# Patient Record
Sex: Male | Born: 1935 | Race: White | Hispanic: No | Marital: Married | Smoking: Never smoker
Health system: Southern US, Community
[De-identification: ages and names within clinical notes are randomized; demographics above are authoritative.]

## PROBLEM LIST (undated history)

## (undated) DIAGNOSIS — I77810 Thoracic aortic ectasia: Secondary | ICD-10-CM

## (undated) DIAGNOSIS — R011 Cardiac murmur, unspecified: Secondary | ICD-10-CM

## (undated) DIAGNOSIS — G4733 Obstructive sleep apnea (adult) (pediatric): Secondary | ICD-10-CM

## (undated) HISTORY — DX: Cardiac murmur, unspecified: R01.1

## (undated) HISTORY — DX: Obstructive sleep apnea (adult) (pediatric): G47.33

## (undated) HISTORY — DX: Thoracic aortic ectasia: I77.810

---

## 2012-11-01 ENCOUNTER — Encounter: Payer: Self-pay | Admitting: Cardiology

## 2013-06-28 ENCOUNTER — Encounter: Payer: Self-pay | Admitting: *Deleted

## 2013-06-28 ENCOUNTER — Encounter: Payer: Self-pay | Admitting: Cardiology

## 2013-06-29 ENCOUNTER — Encounter: Payer: Self-pay | Admitting: Cardiology

## 2013-06-29 DIAGNOSIS — G4733 Obstructive sleep apnea (adult) (pediatric): Secondary | ICD-10-CM | POA: Insufficient documentation

## 2013-06-29 DIAGNOSIS — I77810 Thoracic aortic ectasia: Secondary | ICD-10-CM | POA: Insufficient documentation

## 2013-06-30 ENCOUNTER — Encounter: Payer: Self-pay | Admitting: Cardiology

## 2013-06-30 ENCOUNTER — Ambulatory Visit (INDEPENDENT_AMBULATORY_CARE_PROVIDER_SITE_OTHER): Payer: Medicare Other | Admitting: Cardiology

## 2013-06-30 VITALS — BP 115/70 | HR 76 | Ht 69.0 in | Wt 157.8 lb

## 2013-06-30 DIAGNOSIS — G4733 Obstructive sleep apnea (adult) (pediatric): Secondary | ICD-10-CM

## 2013-06-30 DIAGNOSIS — I499 Cardiac arrhythmia, unspecified: Secondary | ICD-10-CM

## 2013-06-30 DIAGNOSIS — I77810 Thoracic aortic ectasia: Secondary | ICD-10-CM

## 2013-06-30 NOTE — Progress Notes (Signed)
  [address] [place], [place]  [address] Phone: [phone] Fax:  [phone]  Date:  06/30/2013   ID:  DEEN DEGUIA, DOB 1931-12-10, MRN [id number]  PCP:  No primary provider on file.  Sleep medicine:  Armanda Magic, MD     History of Present Illness: Lawrence Watts is a 79 y.o. male with a history of OSA and mildly dilated aortic root.  He is doing well with his CPAP.  He uses a full face mask.  He tolerates the mask and pressure well.  He feels rested when he gets up in the am and has no daytime sleepiness.  He works in the yard and walks for exercise.   Wt Readings from Last 3 Encounters:  06/30/13 157 lb 12.8 oz (71.578 kg)     Past Medical History  Diagnosis Date  . OSA (obstructive sleep apnea)     severe with AHI 39/hr now on CPAP at 11cm H2O  . Dilated aortic root   . Heart murmur     Mild AI and moderate AVSC by echo    Current Outpatient Prescriptions  Medication Sig Dispense Refill  . meloxicam (MOBIC) 15 MG tablet Take 1 tablet by mouth daily.      . pravastatin (PRAVACHOL) 20 MG tablet Take 1 tablet by mouth daily.       No current facility-administered medications for this visit.    Allergies:   No Known Allergies  Social History:  The patient  reports that he has never smoked. He does not have any smokeless tobacco history on file. He reports that he does not drink alcohol or use illicit drugs.   Family History:  The patient's family history includes Dementia in his father and mother; Epilepsy in his sister; Leukemia in his sister.   ROS:  Please see the history of present illness.      All other systems reviewed and negative.   PHYSICAL EXAM: VS:  BP 115/70  Pulse 76  Ht 5\' 9"  (1.753 m)  Wt 157 lb 12.8 oz (71.578 kg)  BMI 23.29 kg/m2 Well nourished, well developed, in no acute distress HEENT: normal Neck: no JVD Cardiac:  normal S1, S2; RRR; no murmur Lungs:  clear to auscultation bilaterally, no wheezing, rhonchi or  rales Abd: soft, nontender, no hepatomegaly Ext: no edema Skin: warm and dry Neuro:  CNs 2-12 intact, no focal abnormalities noted  EKG:  NSR with sinus arrhythmia, IRBBB and septal Q waves      ASSESSMENT AND PLAN:  1. OSA on CPAP tolerating well with no daytime sleepiness  - will get a d/l from his DME 2.  Mildly dilated aorta followed by yearly echo  Followup with me in 6 months  Signed, Armanda Magic, MD 06/30/2013 9:36 AM

## 2013-06-30 NOTE — Patient Instructions (Addendum)
We will be contacting [facility] to call you for a download on your CPAP machine.   Your physician has requested that you have an echocardiogram. Echocardiography is a painless test that uses sound waves to create images of your heart. It provides your doctor with information about the size and shape of your heart and how well your heart's chambers and valves are working. This procedure takes approximately one hour. There are no restrictions for this procedure. (Schedule for 12/2013)   Your physician wants you to follow-up in: 6 months for a sleep f/u with Dr. Sherlyn Lick will receive a reminder letter in the mail two months in advance. If you don't receive a letter, please call our office to schedule the follow-up appointment.

## 2013-07-07 ENCOUNTER — Encounter: Payer: Self-pay | Admitting: Cardiology

## 2013-09-08 ENCOUNTER — Telehealth: Payer: Self-pay | Admitting: Cardiology

## 2013-09-08 NOTE — Telephone Encounter (Signed)
Spoke with pt and he was able to buy a used machine thru [facility] and he just needed to know the pressure on his machine so they could set this one for him.

## 2013-09-08 NOTE — Telephone Encounter (Signed)
New Problem:  Pt's girlfriend, Mrs. Lawrence Watts, is stating the pt left his c-pap at home and wants to know if he can rent one. Mrs. Lawrence Watts states she was told she could rent one from any hospital. Mrs Lawrence Watts wants to know if Dr. Mayford Knife or her nurse can help the pt by calling the hospital and helping them rent one for a couple nights. Mrs. Lawrence Watts is requesting a call back.

## 2013-12-29 ENCOUNTER — Ambulatory Visit (HOSPITAL_COMMUNITY): Payer: Medicare Other | Attending: Cardiology | Admitting: Radiology

## 2013-12-29 DIAGNOSIS — R011 Cardiac murmur, unspecified: Secondary | ICD-10-CM

## 2013-12-29 DIAGNOSIS — I77819 Aortic ectasia, unspecified site: Secondary | ICD-10-CM | POA: Insufficient documentation

## 2013-12-29 DIAGNOSIS — I77810 Thoracic aortic ectasia: Secondary | ICD-10-CM

## 2013-12-29 NOTE — Progress Notes (Signed)
Echocardiogram performed.  

## 2014-01-04 ENCOUNTER — Other Ambulatory Visit: Payer: Self-pay | Admitting: General Surgery

## 2014-01-04 DIAGNOSIS — I77810 Thoracic aortic ectasia: Secondary | ICD-10-CM

## 2014-01-04 DIAGNOSIS — G4733 Obstructive sleep apnea (adult) (pediatric): Secondary | ICD-10-CM

## 2014-01-07 ENCOUNTER — Other Ambulatory Visit: Payer: Medicare Other

## 2014-01-10 ENCOUNTER — Other Ambulatory Visit: Payer: Medicare Other

## 2014-02-02 ENCOUNTER — Other Ambulatory Visit (INDEPENDENT_AMBULATORY_CARE_PROVIDER_SITE_OTHER): Payer: Medicare Other

## 2014-02-02 DIAGNOSIS — I77810 Thoracic aortic ectasia: Secondary | ICD-10-CM

## 2014-02-02 LAB — BASIC METABOLIC PANEL
BUN: 14 mg/dL (ref 6–23)
CALCIUM: 9 mg/dL (ref 8.4–10.5)
CO2: 29 meq/L (ref 19–32)
CREATININE: 0.6 mg/dL (ref 0.4–1.5)
Chloride: 105 mEq/L (ref 96–112)
GFR: 128.01 mL/min (ref >60.00)
Glucose, Bld: 124 mg/dL — ABNORMAL HIGH (ref 70–99)
Potassium: 4.1 mEq/L (ref 3.5–5.1)
SODIUM: 139 meq/L (ref 135–145)

## 2014-02-03 ENCOUNTER — Ambulatory Visit (INDEPENDENT_AMBULATORY_CARE_PROVIDER_SITE_OTHER)
Admission: RE | Admit: 2014-02-03 | Discharge: 2014-02-03 | Disposition: A | Discharge status: Home / Self Care | Payer: Medicare Other | Source: Ambulatory Visit | Attending: Cardiology | Admitting: Cardiology

## 2014-02-03 DIAGNOSIS — I77810 Thoracic aortic ectasia: Secondary | ICD-10-CM

## 2014-02-03 MED ORDER — IOHEXOL 350 MG/ML SOLN
100.0000 mL | Freq: Once | INTRAVENOUS | Status: AC | PRN
  Administered 2014-02-03: 100 mL via INTRAVENOUS

## 2014-02-07 ENCOUNTER — Telehealth: Payer: Self-pay | Admitting: Cardiology

## 2014-02-07 DIAGNOSIS — I2510 Atherosclerotic heart disease of native coronary artery without angina pectoris: Secondary | ICD-10-CM

## 2014-02-07 DIAGNOSIS — I712 Thoracic aortic aneurysm, without rupture: Secondary | ICD-10-CM

## 2014-02-07 DIAGNOSIS — I2584 Coronary atherosclerosis due to calcified coronary lesion: Secondary | ICD-10-CM

## 2014-02-07 DIAGNOSIS — I7121 Aneurysm of the ascending aorta, without rupture: Secondary | ICD-10-CM

## 2014-02-07 NOTE — Telephone Encounter (Signed)
New message    Has questions regarding test results report.

## 2014-02-07 NOTE — Telephone Encounter (Signed)
Myoview scheduled on 02/22/14.

## 2014-02-07 NOTE — Telephone Encounter (Signed)
Notes Recorded by Sharyn Blitz, RN on 02/07/2014 at 10:46 AM I spoke with the pt and made him aware of results. The pt would like a copy of results mailed to his home to take to PCP in [place] for follow-up of lung nodule. I will refer the pt to Dr Laneta Simmers and have scheduling contact the pt for [facility].     Notes Recorded by Quintella Reichert, MD on 02/03/2014 at 12:01 PM He also was noted to have moderate coronary artery calcifications so please set up for Lexiscan myoview to assess for ischemia ------  Notes Recorded by Quintella Reichert, MD on 02/03/2014 at 12:00 PM Please let patient know that echo findings are accurate and chest CT shows moderate ascending aortic dilatation at 4.6 x 4.5 cm diameter. Please refer to Dr. Laneta Simmers for further evaluation. Also his chest CT showed a 15mm lung nodule that needs to be followed by his PCP - please forward this to his PCP and notify his PCP's nurse that this need to be followed up on

## 2014-02-09 ENCOUNTER — Encounter: Payer: Medicare Other | Admitting: Surgery

## 2014-02-22 ENCOUNTER — Ambulatory Visit (HOSPITAL_COMMUNITY): Payer: Medicare Other | Attending: Cardiology | Admitting: Radiology

## 2014-02-22 VITALS — BP 145/83 | HR 55 | Ht 69.0 in | Wt 158.0 lb

## 2014-02-22 DIAGNOSIS — Z87891 Personal history of nicotine dependence: Secondary | ICD-10-CM | POA: Insufficient documentation

## 2014-02-22 DIAGNOSIS — I2510 Atherosclerotic heart disease of native coronary artery without angina pectoris: Secondary | ICD-10-CM | POA: Insufficient documentation

## 2014-02-22 DIAGNOSIS — R9439 Abnormal result of other cardiovascular function study: Secondary | ICD-10-CM

## 2014-02-22 DIAGNOSIS — I2584 Coronary atherosclerosis due to calcified coronary lesion: Secondary | ICD-10-CM

## 2014-02-22 DIAGNOSIS — I77819 Aortic ectasia, unspecified site: Secondary | ICD-10-CM | POA: Insufficient documentation

## 2014-02-22 MED ORDER — TECHNETIUM TC 99M SESTAMIBI GENERIC - CARDIOLITE
10.8000 | Freq: Once | INTRAVENOUS | Status: AC | PRN
  Administered 2014-02-22: 11 via INTRAVENOUS

## 2014-02-22 MED ORDER — REGADENOSON 0.4 MG/5ML IV SOLN
0.4000 mg | Freq: Once | INTRAVENOUS | Status: AC
  Administered 2014-02-22: 0.4 mg via INTRAVENOUS

## 2014-02-22 MED ORDER — TECHNETIUM TC 99M SESTAMIBI GENERIC - CARDIOLITE
33.0000 | Freq: Once | INTRAVENOUS | Status: AC | PRN
  Administered 2014-02-22: 33 via INTRAVENOUS

## 2014-02-22 NOTE — Progress Notes (Signed)
[facility] NUCLEAR MED [address] [place], [place] [address] [phone]    Cardiology Nuclear Med Study  Lynnell CatalanRobert M Luevano is a 80 y.o. male     MRN : [id number]     DOB: 01/14/1932  Procedure Date: 02/22/2014  Nuclear Med Background Indication for Stress Test:  Evaluation for Ischemia, and 01-04-14 CT Chest: Moderate Coronary Calcification, and Moderate Ascending Aortic Dilatation History:  No known CAD, Echo 2015 EF 55-60% (mildly dilated aortic root) Cardiac Risk Factors: History of Smoking, Lipids and IRBBB  Symptoms:  None indicated   Nuclear Pre-Procedure Caffeine/Decaff Intake:  None> 12 hrs NPO After: 6:00pm   Lungs:  clear O2 Sat: 97% on room air. IV 0.9% NS with Angio Cath:  22g  IV Site: R Hand x 1, tolerated well IV Started by:  Irean HongPatsy Edwards, RN  Chest Size (in):  38 Cup Size: n/a  Height: 5\' 9"  (1.753 m)  Weight:  158 lb (71.668 kg)  BMI:  Body mass index is 23.32 kg/(m^2). Tech Comments:  N/A    Nuclear Med Study 1 or 2 day study: 1 day  Stress Test Type:  Treadmill/Lexiscan  Reading MD: N/A  Order Authorizing Provider: Armanda Magicraci Turner, MD  Resting Radionuclide: Technetium 4764m Sestamibi  Resting Radionuclide Dose: 11.0 mCi   Stress Radionuclide:  Technetium 2464m Sestamibi  Stress Radionuclide Dose: 33.0 mCi           Stress Protocol Rest HR: 55 Stress HR: 95  Rest BP: 145/83 Stress BP: 132/73  Exercise Time (min): n/a METS: n/a           Dose of Adenosine (mg):  n/a Dose of Lexiscan: 0.4 mg  Dose of Atropine (mg): n/a Dose of Dobutamine: n/a mcg/kg/min (at max HR)  Stress Test Technologist: Nelson ChimesSharon Brooks, BS-ES  Nuclear Technologist:  Doyne Keelonya Yount, CNMT     Rest Procedure:  Myocardial perfusion imaging was performed at rest 45 minutes following the intravenous administration of Technetium 1164m Sestamibi. Rest ECG: Sinus bradycardia rate 55 with nonspecific QRS widening  Stress Procedure:  The patient received IV Lexiscan 0.4 mg  over 15-seconds with concurrent low level exercise and then Technetium 6664m Sestamibi was injected at 30-seconds while the patient continued walking one more minute.  Quantitative spect images were obtained after a 45-minute delay.  During the infusion of Lexiscan the patient complained of SOB, dizziness and pressure behind his eyes.  These symptoms began to resolve in recovery.  Stress ECG: No significant change from baseline ECG  QPS Raw Data Images:  Normal; no motion artifact; normal heart/lung ratio. Stress Images:  There is subtle decreased uptake along the basal inferior lateral portion seen at both rest and stress. No evidence of ischemia identified. Rest Images:  As above Subtraction (SDS):  No evidence of ischemia. Transient Ischemic Dilatation (Normal <1.22):  1.08 Lung/Heart Ratio (Normal <0.45):  0.23  Quantitative Gated Spect Images QGS EDV:  104 ml QGS ESV:  52 ml  Impression Exercise Capacity:  Lexiscan with low level exercise. BP Response:  Normal blood pressure response. Clinical Symptoms:  Typical symptoms with Lexiscan. ECG Impression:  No significant ST segment change suggestive of ischemia. Comparison with Prior Nuclear Study: No images to compare  Overall Impression:  Low risk stress nuclear study with no areas of ischemia identified..  LV Ejection Fraction: 50%.  LV Wall Motion:  NL LV Function; NL Wall Motion  Donato SchultzSKAINS, MARK, MD

## 2014-02-23 ENCOUNTER — Encounter: Payer: Self-pay | Admitting: Surgery

## 2014-02-23 ENCOUNTER — Institutional Professional Consult (permissible substitution) (INDEPENDENT_AMBULATORY_CARE_PROVIDER_SITE_OTHER): Payer: Medicare Other | Admitting: Surgery

## 2014-02-23 VITALS — BP 128/71 | HR 69 | Resp 16 | Ht 69.0 in | Wt 160.0 lb

## 2014-02-23 DIAGNOSIS — I712 Thoracic aortic aneurysm, without rupture, unspecified: Secondary | ICD-10-CM

## 2014-02-27 ENCOUNTER — Encounter: Payer: Self-pay | Admitting: Surgery

## 2014-02-27 NOTE — Progress Notes (Signed)
Cardiothoracic Surgery Consultation   PCP is No primary provider on file. Referring Provider is Quintella Reicherturner, Traci R, MD  Chief Complaint  Patient presents with  . Thoracic Aortic Aneurysm    eval and treat.Marland Kitchen.Marland Kitchen.CTA CHEST 02/03/14    HPI:  The patient is a 80 year old fairly active gentleman with a history of OSA on CPAP who has a known dilated aortic root that has been followed by echo. An echo on 12/29/2013 showed the diameter of the ascending aorta to be 44 mm and an echo 1 year ago showed it to be 36 mm. He had a CTA of the chest on 02/07/2014 that showed the ascending aorta to be 4.6 x 4.5 cm. There was also a 5 mm non-calcified nodule in the RUL adjacent to the minor fissure.  Past Medical History  Diagnosis Date  . OSA (obstructive sleep apnea)     severe with AHI 39/hr now on CPAP at 11cm H2O  . Dilated aortic root   . Heart murmur     Mild AI and moderate AVSC by echo    History reviewed. No pertinent past surgical history.  Family History  Problem Relation Age of Onset  . Dementia Father   . Dementia Mother   . Leukemia Sister   . Epilepsy Sister     Social History History  Substance Use Topics  . Smoking status: Never Smoker   . Smokeless tobacco: Not on file  . Alcohol Use: No    Current Outpatient Prescriptions  Medication Sig Dispense Refill  . pravastatin (PRAVACHOL) 20 MG tablet Take 1 tablet by mouth daily.       No current facility-administered medications for this visit.    No Known Allergies  Review of Systems  Constitutional: Negative.   HENT: Negative.   Eyes: Negative.   Respiratory: Negative.   Cardiovascular: Negative.   Gastrointestinal: Negative.   Endocrine: Negative.   Genitourinary: Negative.   Musculoskeletal: Negative.   Allergic/Immunologic: Negative.   Neurological: Negative.   Hematological: Negative.   Psychiatric/Behavioral: Negative.     BP 128/71  Pulse 69  Resp 16  Ht 5\' 9"  (1.753 m)  Wt 160 lb (72.576 kg)  BMI  23.62 kg/m2  SpO2 97% Physical Exam  Constitutional: He is oriented to person, place, and time. He appears well-developed and well-nourished. No distress.  HENT:  Head: Normocephalic and atraumatic.  Mouth/Throat: Oropharynx is clear and moist.  Eyes: EOM are normal. Pupils are equal, round, and reactive to light.  Neck: Normal range of motion. Neck supple. No thyromegaly present.  Cardiovascular: Normal rate, regular rhythm, normal heart sounds and intact distal pulses.   No murmur heard. Pulmonary/Chest: Effort normal. No respiratory distress. He has no wheezes.  Abdominal: Soft. Bowel sounds are normal.  Musculoskeletal: He exhibits no edema.  Lymphadenopathy:    He has no cervical adenopathy.  Neurological: He is alert and oriented to person, place, and time. He has normal strength. No cranial nerve deficit or sensory deficit.  Skin: Skin is warm and dry.  Psychiatric: He has a normal mood and affect.    Diagnostic Tests:  CLINICAL DATA: Followup dilated aortic root.  EXAM:  CT ANGIOGRAPHY CHEST WITH CONTRAST  TECHNIQUE:  Multidetector CT imaging of the chest was performed using the  standard protocol during bolus administration of intravenous  contrast. Multiplanar CT image reconstructions and MIPs were  obtained to evaluate the vascular anatomy.  CONTRAST: 100mL OMNIPAQUE IOHEXOL 350 MG/ML SOLN  COMPARISON: None.  FINDINGS:  The ascending aorta measures 4.6 cm x 4.5 cm in greatest transverse  dimension. It decreases in caliber along the aortic arch 20 normal  caliber descending thoracic aorta. There is atherosclerotic plaque  along the arch and descending thoracic aorta without significant  stenosis. No dissection. Heart is normal in size. There are moderate  coronary artery calcifications. No mediastinal or hilar masses or  adenopathy. Neck base and axilla are unremarkable.  Lungs show mild peripheral interstitial thickening. A calcified  granuloma is noted in the  right upper lobe. There is a noncalcified  5 mm nodule along the minor fissure in the inferior right upper  lobe. No other nodules. No consolidation. No edema. No pleural  effusion.  Limited evaluation of the upper abdomen shows multiple low-density  liver lesions consistent with cysts.  Minor curvature of the thoracic spine, convex the right. Minor disk  degenerative changes of the thoracic spine. Bony structures are  otherwise unremarkable.  Review of the MIP images confirms the above findings.  IMPRESSION:  1. Dilation of the ascending aorta to 4.5 cm x 4.6 cm. No  dissection.  2. No acute findings.  3. 5 mm noncalcified right upper lobe nodule adjacent to the minor  fissure. If the patient is at high risk for bronchogenic carcinoma,  follow-up chest CT at 6-12 months is recommended. If the patient is  at low risk for bronchogenic carcinoma, follow-up chest CT at 12  months is recommended. This recommendation follows the consensus  statement: Guidelines for Management of Small Pulmonary Nodules  Detected on CT Scans: A Statement from the Fleischner Society as  published in Radiology 2005;237:395-400.  4. Low-density liver masses consistent with cysts.  Electronically Signed  By: Amie Portland M.D.  On: 02/03/2014 10:45  *[facility]* [address] [place], [place] [address] [phone]  ------------------------------------------------------------ Transthoracic Echocardiography  Patient: Jushua, Waltman MR #: [id number] Study Date: 12/29/2013 Gender: M Age: 80 Height: 175.3cm Weight: 72.6kg BSA: 1.38m^2 Pt. Status: Room:  ORDERING Armanda Magic, MD REFERRING Armanda Magic, MD ATTENDING Default, Provider W SONOGRAPHER Aida Raider, RDCS PERFORMING [facility], Outpatient cc:  ------------------------------------------------------------ LV EF: 55% - 60%  ------------------------------------------------------------ Indications: Dilated Aortic Root 447.71  Murmur 785.2.  ------------------------------------------------------------ History: PMH: Acquired from the patient and from the patient's chart. PMH: Obstructive sleep apnea-CPAP. Dilated Aortic Root.  ------------------------------------------------------------ Study Conclusions  - Left ventricle: Systolic function was normal. The estimated ejection fraction was in the range of 55% to 60%. Wall motion was normal; there were no regional wall motion abnormalities. Left ventricular diastolic function parameters were normal. - Aortic valve: Trivial regurgitation. - Left atrium: The atrium was mildly dilated. - Atrial septum: No defect or patent foramen ovale was identified. Transthoracic echocardiography. M-mode, complete 2D, spectral Doppler, and color Doppler. Height: Height: 175.3cm. Height: 69in. Weight: Weight: 72.6kg. Weight: 159.7lb. Body mass index: BMI: 23.6kg/m^2. Body surface area: BSA: 1.103m^2. Blood pressure: 115/70. Patient status: Outpatient. Location: [facility]  ------------------------------------------------------------  ------------------------------------------------------------ Left ventricle: Wall thickness was normal. Systolic function was normal. The estimated ejection fraction was in the range of 55% to 60%. Wall motion was normal; there were no regional wall motion abnormalities. The transmitral flow pattern was normal. The deceleration time of the early transmitral flow velocity was normal. The pulmonary vein flow pattern was normal. The tissue Doppler parameters were normal. Left ventricular diastolic function parameters were normal.  ------------------------------------------------------------ Aortic valve: Trileaflet; mildly thickened leaflets. Cusp separation was normal. Sclerosis without stenosis. Doppler: Transvalvular  velocity was within the normal range. There was no stenosis. Trivial  regurgitation.  ------------------------------------------------------------ Aorta: Aortic root: The aortic root was normal in size. Ascending aorta: The ascending aorta was mildly dilated.  ------------------------------------------------------------ Mitral valve: Structurally normal valve. Leaflet separation was normal. Doppler: Transvalvular velocity was within the normal range. There was no evidence for stenosis. No regurgitation. Peak gradient: 3mm Hg (D).  ------------------------------------------------------------ Left atrium: The atrium was mildly dilated.  ------------------------------------------------------------ Atrial septum: No defect or patent foramen ovale was identified.  ------------------------------------------------------------ Right ventricle: The cavity size was normal. Wall thickness was normal. Systolic function was normal.  ------------------------------------------------------------ Pulmonic valve: Poorly visualized. The valve appears to be grossly normal.  ------------------------------------------------------------ Tricuspid valve: Structurally normal valve. Leaflet separation was normal. Doppler: Transvalvular velocity was within the normal range. Trivial regurgitation.  ------------------------------------------------------------ Pulmonary artery: Systolic pressure was within the normal range.  ------------------------------------------------------------ Right atrium: The atrium was normal in size.  ------------------------------------------------------------ Pericardium: There was no pericardial effusion.  ------------------------------------------------------------ Systemic veins: Inferior vena cava: The vessel was normal in size; the respirophasic diameter changes were in the normal range (= 50%); findings are consistent with normal central venous pressure.  ------------------------------------------------------------  2D  measurements Normal Doppler measurements Normal Left ventricle Main pulmonary LVID ED, 41.9 mm 43-52 artery chord, Pressure, 21 mm Hg =30 PLAX S LVID ES, 28.1 mm 23-38 Left ventricle chord, Ea, lat 9.21 cm/s ------ PLAX ann, tiss FS, chord, 33 % >29 DP PLAX E/Ea, lat 8.59 ------ LVPW, ED 10.7 mm ------ ann, tiss IVS/LVPW 1.26 <1.3 DP ratio, ED Ea, med 6.36 cm/s ------ Ventricular septum ann, tiss IVS, ED 13.5 mm ------ DP LVOT E/Ea, med 12.44 ------ Diam, S 22 mm ------ ann, tiss Area 3.8 cm^2 ------ DP Diam 22 mm ------ LVOT Aorta Peak vel, 118 cm/s ------ Root diam, 33 mm ------ S ED VTI, S 25.7 cm ------ AAo AP 44 mm ------ Peak 6 mm Hg ------ diam, S gradient, Left atrium S AP dim 43 mm ------ Stroke vol 97.7 ml ------ AP dim 2.29 cm/m^2 <2.2 Stroke 52 ml/m^ ------ index index 2 Aortic valve Regurg PHT 642 ms ------ Mitral valve Peak E vel 79.1 cm/s ------ Peak A vel 67.5 cm/s ------ Decelerati 275 ms 150-23 on time 0 Peak 3 mm Hg ------ gradient, D Peak E/A 1.2 ------ ratio Tricuspid valve Regurg 212 cm/s ------ peak vel Peak RV-RA 18 mm Hg ------ gradient, S Systemic veins Estimated 3 mm Hg ------ CVP Right ventricle Pressure, 21 mm Hg <30 S Sa vel, 13.2 cm/s ------ lat ann, tiss DP  ------------------------------------------------------------ Prepared and Electronically Authenticated by  Croitoru, Mihai 2015-04-22T15:[id number]        Impression:  He has an asymptomatic 4.5 x 4.6 cm ascending aortic aneurysm. This does not require treatment at this time but will require close follow up to determine its stability. It did increase in size over the past year by echo but those measurements are more prone to variability. He also has a 5 mm lung nodule that will require follow up. He lives in [place] but would like me to follow these lesions since [place] at least monthly. I reviewed the CT scan with him and stressed the importance of  good blood pressure control.  Plan:  I will plan to see him back in 1 year with a CTA of the chest.

## 2014-10-06 IMAGING — CT CT ANGIO CHEST
2 of 5 series · 18 of 36 positions shown · IV contrast (Omnipaque 300)
Comparison: None.

CLINICAL DATA: Followup dilated aortic root.

EXAM:
CT ANGIOGRAPHY CHEST WITH CONTRAST
TECHNIQUE: Multidetector CT imaging of the chest was performed using the
standard protocol during bolus administration of intravenous
contrast. Multiplanar CT image reconstructions and MIPs were
obtained to evaluate the vascular anatomy.
CONTRAST:  100mL OMNIPAQUE IOHEXOL 350 MG/ML SOLN

[Series 4: cta chest w/cm 3mm · axial · 0.69mm/px · z∈[-260,-36]mm · 17 of 81 slices shown]
[im 3/81  lung]
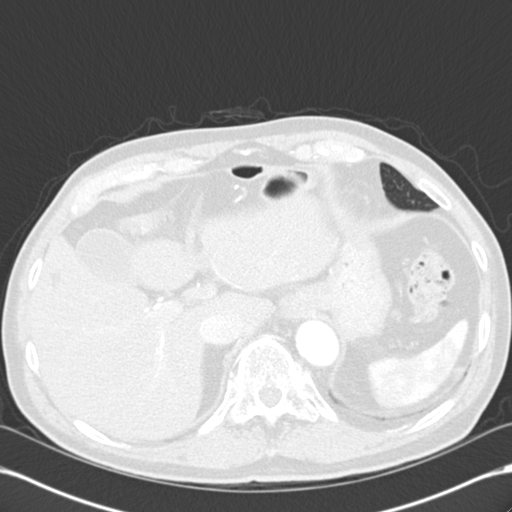
[im 9/81  mediastinal]
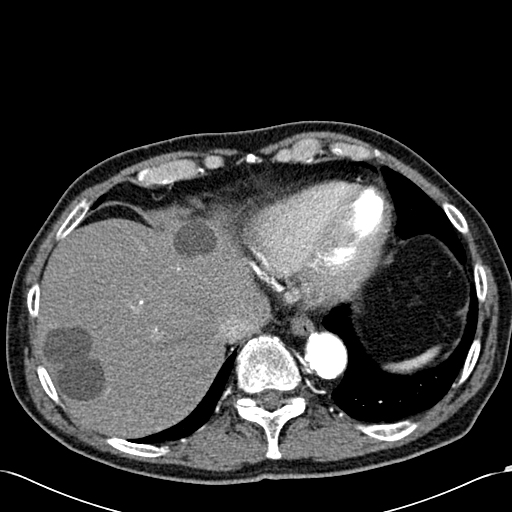
[im 12/81  lung]
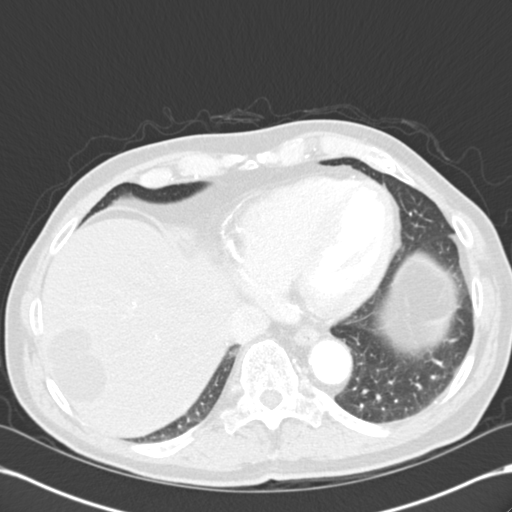
[im 18/81  mediastinal]
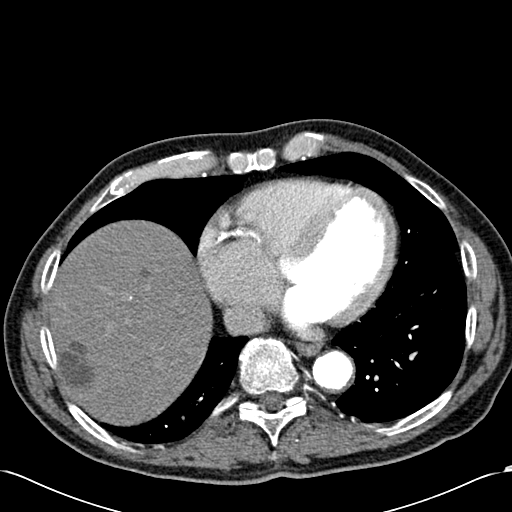
[im 21/81  lung]
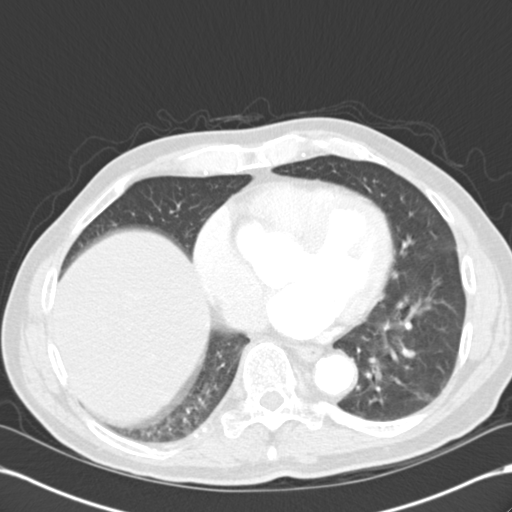
[im 27/81  mediastinal]
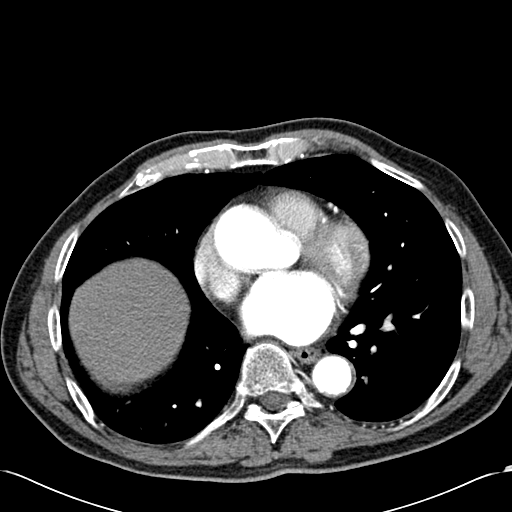
[im 30/81  lung]
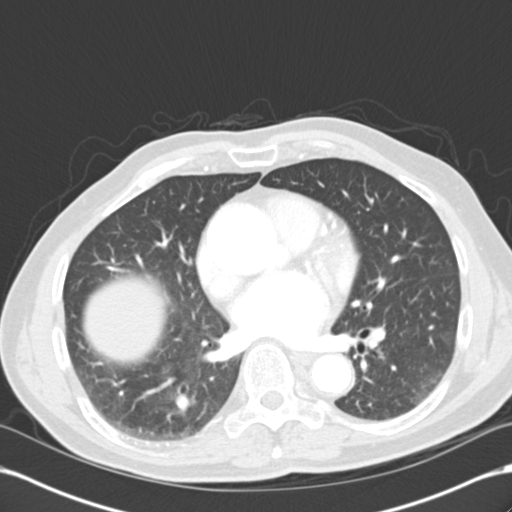
[im 36/81  mediastinal]
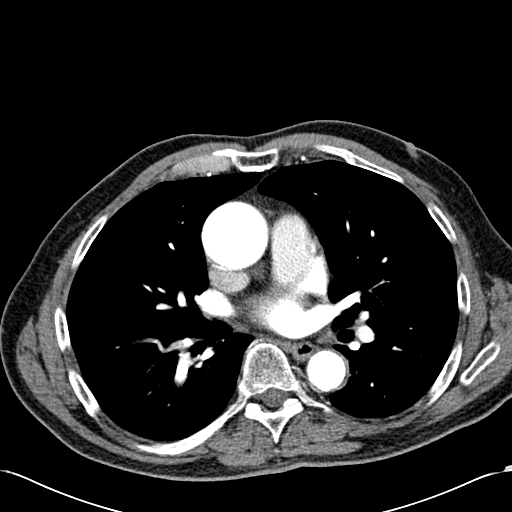
[im 42/81  lung]
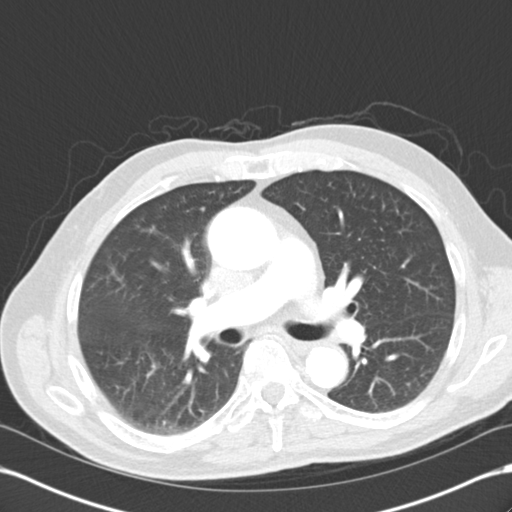
[im 45/81  mediastinal]
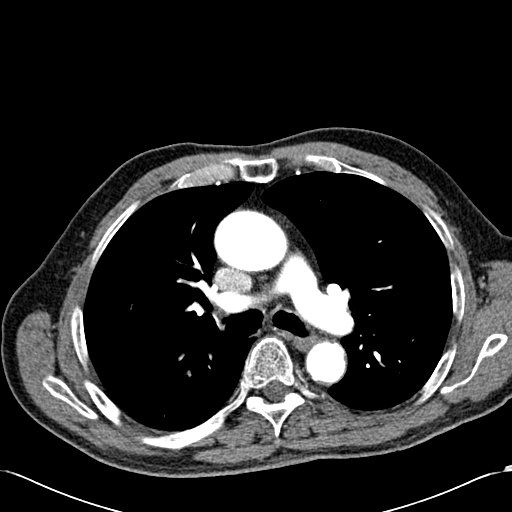
[im 51/81  lung]
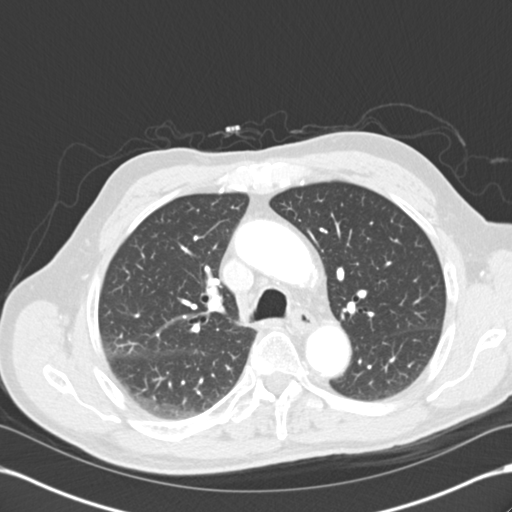
[im 54/81  mediastinal]
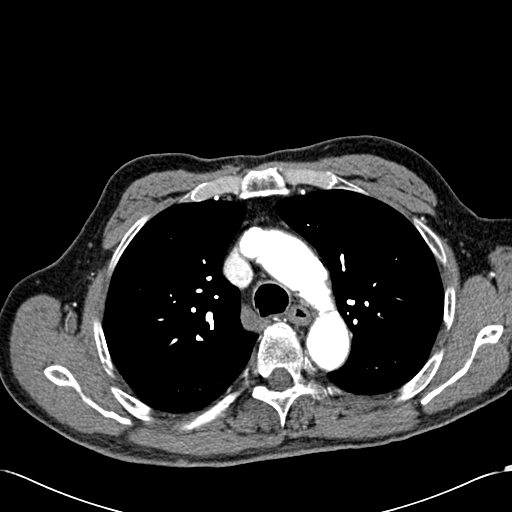
[im 60/81  lung]
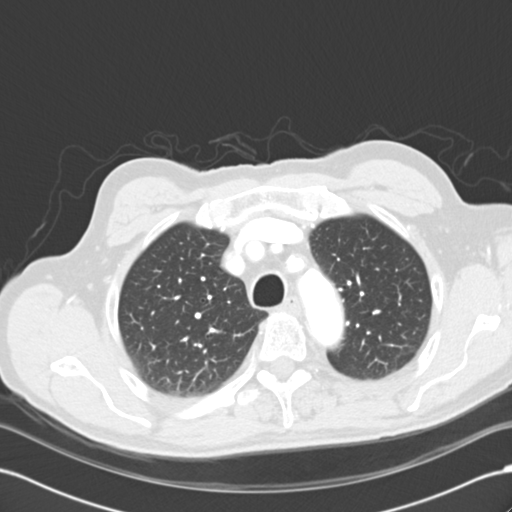
[im 63/81  mediastinal]
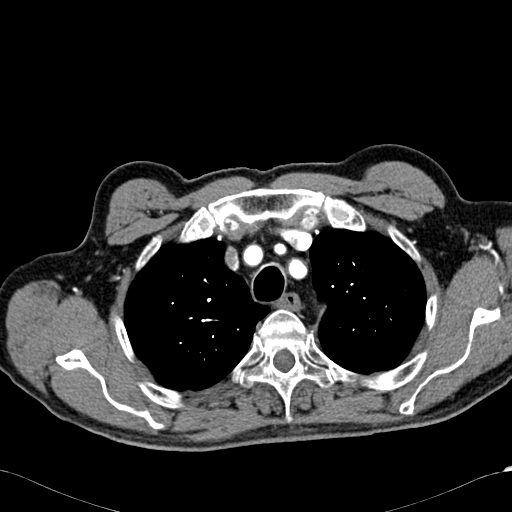
[im 69/81  lung]
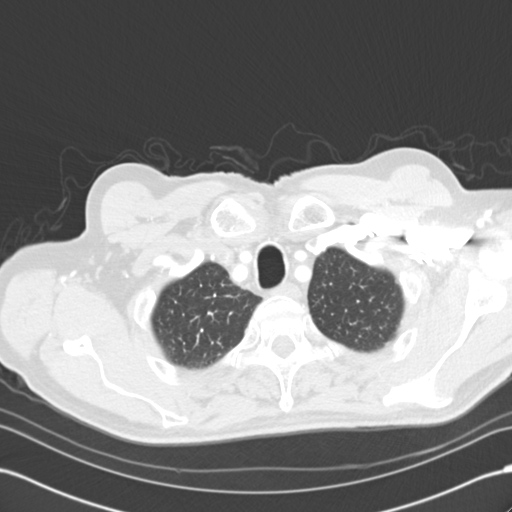
[im 72/81  mediastinal]
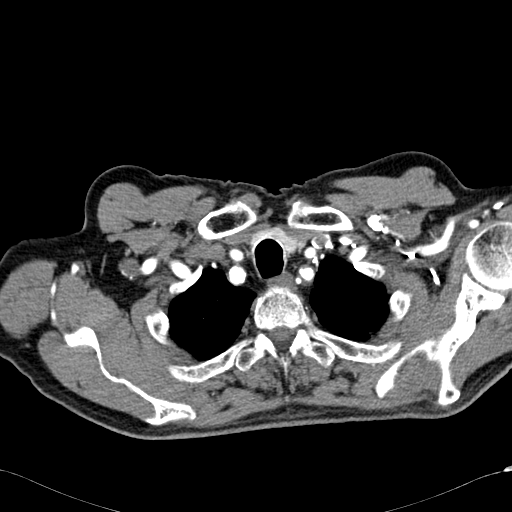
[im 78/81  lung]
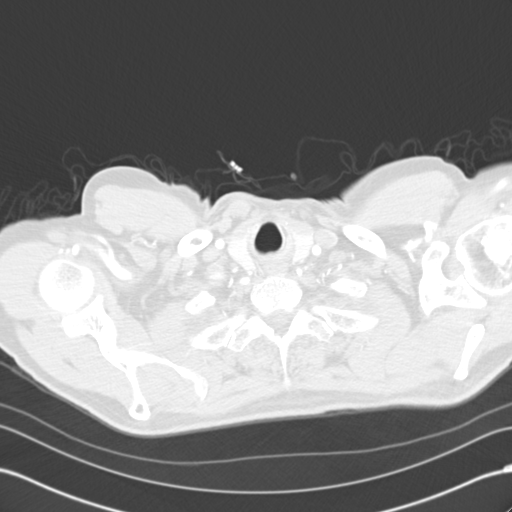

[Series 602: cor · coronal · 0.69mm/px · 1 of 104 slices shown]
[im 52/104  mediastinal]
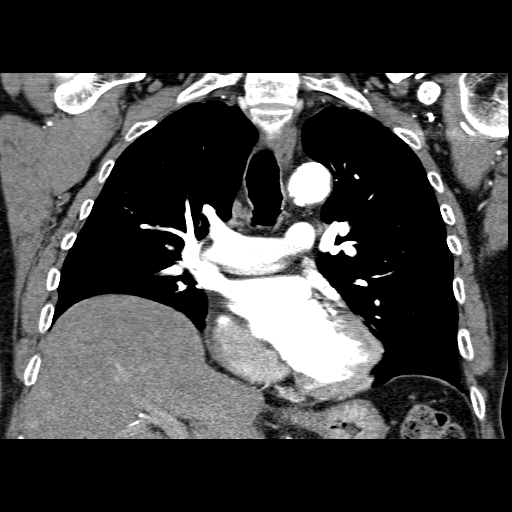

[18 of 36 positions shown; findings below may reference images not displayed]

FINDINGS: The ascending aorta measures 4.6 cm x 4.5 cm in greatest transverse
dimension. It decreases in caliber along the aortic arch 20 normal
caliber descending thoracic aorta. There is atherosclerotic plaque
along the arch and descending thoracic aorta without significant
stenosis. No dissection. Heart is normal in size. There are moderate
coronary artery calcifications. No mediastinal or hilar masses or
adenopathy. Neck base and axilla are unremarkable.

Lungs show mild peripheral interstitial thickening. A calcified
granuloma is noted in the right upper lobe. There is a noncalcified
5 mm nodule along the minor fissure in the inferior right upper
lobe. No other nodules. No consolidation. No edema. No pleural
effusion.

Limited evaluation of the upper abdomen shows multiple low-density
liver lesions consistent with cysts.

Minor curvature of the thoracic spine, convex the right. Minor disk
degenerative changes of the thoracic spine. Bony structures are
otherwise unremarkable.

Review of the MIP images confirms the above findings.
IMPRESSION: 1. Dilation of the ascending aorta to 4.5 cm x 4.6 cm. No
dissection.
2. No acute findings.
3. 5 mm noncalcified right upper lobe nodule adjacent to the minor
fissure. If the patient is at high risk for bronchogenic carcinoma,
follow-up chest CT at 6-12 months is recommended. If the patient is
at low risk for bronchogenic carcinoma, follow-up chest CT at 12
months is recommended. This recommendation follows the consensus
statement: Guidelines for Management of Small Pulmonary Nodules
Detected on CT Scans: A Statement from the [HOSPITAL] as
published in Radiology 5664;[DATE].
4. Low-density liver masses consistent with cysts.

## 2014-11-18 ENCOUNTER — Telehealth: Payer: Self-pay | Admitting: Oncology

## 2015-02-08 ENCOUNTER — Other Ambulatory Visit: Payer: Self-pay | Admitting: *Deleted

## 2015-02-08 DIAGNOSIS — I719 Aortic aneurysm of unspecified site, without rupture: Secondary | ICD-10-CM

## 2015-02-22 ENCOUNTER — Encounter: Payer: Medicare Other | Admitting: Surgery

## 2015-02-22 ENCOUNTER — Inpatient Hospital Stay: Admission: RE | Admit: 2015-02-22 | Payer: Self-pay | Source: Ambulatory Visit

## 2015-04-12 ENCOUNTER — Encounter: Payer: Self-pay | Admitting: Gastroenterology

## 2020-10-10 DEATH — deceased
